# Patient Record
Sex: Male | Born: 2000 | Race: White | Hispanic: No | Marital: Single | Smoking: Never smoker
Health system: Southern US, Community
[De-identification: ages and names within clinical notes are randomized; demographics above are authoritative.]

## PROBLEM LIST (undated history)

## (undated) DIAGNOSIS — J189 Pneumonia, unspecified organism: Secondary | ICD-10-CM

## (undated) HISTORY — DX: Pneumonia, unspecified organism: J18.9

## (undated) HISTORY — PX: FRACTURE SURGERY: SHX138

## (undated) HISTORY — PX: APPENDECTOMY: SHX54

---
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
## 2021-02-05 ENCOUNTER — Ambulatory Visit: Payer: Commercial Managed Care - PPO | Admitting: Pediatrics

## 2021-02-26 ENCOUNTER — Ambulatory Visit: Payer: Commercial Managed Care - PPO | Admitting: Pediatrics

## 2021-03-06 ENCOUNTER — Ambulatory Visit: Payer: Self-pay

## 2021-03-19 ENCOUNTER — Ambulatory Visit: Payer: Commercial Managed Care - PPO | Admitting: Pediatrics
